# Patient Record
Sex: Female | Born: 1995 | Race: White | Hispanic: No | Marital: Single | Smoking: Never smoker
Health system: Southern US, Community
[De-identification: ages and names within clinical notes are randomized; demographics above are authoritative.]

## PROBLEM LIST (undated history)

## (undated) DIAGNOSIS — R011 Cardiac murmur, unspecified: Secondary | ICD-10-CM

## (undated) DIAGNOSIS — J45909 Unspecified asthma, uncomplicated: Secondary | ICD-10-CM

## (undated) HISTORY — PX: WISDOM TOOTH EXTRACTION: SHX21

## (undated) HISTORY — PX: PATENT DUCTUS ARTERIOUS REPAIR: SHX269

---

## 2015-04-04 ENCOUNTER — Ambulatory Visit: Payer: Self-pay | Admitting: Family Medicine

## 2015-04-09 ENCOUNTER — Ambulatory Visit (INDEPENDENT_AMBULATORY_CARE_PROVIDER_SITE_OTHER): Payer: BLUE CROSS/BLUE SHIELD | Admitting: Family Medicine

## 2015-04-09 ENCOUNTER — Encounter: Payer: Self-pay | Admitting: Family Medicine

## 2015-04-09 VITALS — BP 110/60 | HR 83 | Temp 98.7°F | Ht 66.5 in | Wt 124.0 lb

## 2015-04-09 DIAGNOSIS — Z8709 Personal history of other diseases of the respiratory system: Secondary | ICD-10-CM

## 2015-04-09 DIAGNOSIS — J029 Acute pharyngitis, unspecified: Secondary | ICD-10-CM

## 2015-04-09 DIAGNOSIS — Z8619 Personal history of other infectious and parasitic diseases: Secondary | ICD-10-CM

## 2015-04-09 LAB — POCT RAPID STREP A (OFFICE): Rapid Strep A Screen: NEGATIVE

## 2015-04-09 NOTE — Progress Notes (Signed)
   Subjective:    Patient ID: Sherry Cummings, female    DOB: March 15, 1996, 19 y.o.   MRN: [id number]  HPI She is here for consult concerning history of strep pharyngitis. She did bring information with her concerning the diagnosis of strep. She was positive and treated with penicillin in early May. She was retested and a culture done which did show group see strep. She then had another episode of sore throat and was seen in an urgent care center. They strep screen again was positive and she was given amoxicillin. She recently finished this course and does complain of a slight sore throat.   Review of Systems     Objective:   Physical Exam Alert and in no distress. Tympanic membranes and canals are normal. Pharyngeal area is slightly red.. Neck is supple without adenopathy or thyromegaly. Cardiac exam shows a regular sinus rhythm without murmurs or gallops. Lungs are clear to auscultation.        Assessment & Plan:  Sore throat - Plan: POCT rapid strep A, Culture, Group A Strep  History of strep pharyngitis  the strep screen was negative however since she has had 2 positive strep tests, I think a culture would be appropriate. I discussed the fact that if it is indeed positive, I would switch her to a different anabiotic and then again retest her.

## 2015-04-10 ENCOUNTER — Telehealth: Payer: Self-pay | Admitting: Family Medicine

## 2015-04-10 NOTE — Telephone Encounter (Signed)
Pt called and stated she is feeling a lot worse today. She had fever and chills last night and today her throat is worse. Pt can be reached at [phone] and [facility] [address]. Please call pt.

## 2015-04-10 NOTE — Telephone Encounter (Signed)
error 

## 2015-04-11 ENCOUNTER — Other Ambulatory Visit (INDEPENDENT_AMBULATORY_CARE_PROVIDER_SITE_OTHER): Payer: BLUE CROSS/BLUE SHIELD

## 2015-04-11 DIAGNOSIS — J029 Acute pharyngitis, unspecified: Secondary | ICD-10-CM | POA: Diagnosis not present

## 2015-04-11 LAB — POCT RAPID STREP A (OFFICE): Rapid Strep A Screen: NEGATIVE

## 2015-04-11 LAB — CULTURE, GROUP A STREP: ORGANISM ID, BACTERIA: NORMAL

## 2015-04-12 ENCOUNTER — Encounter: Payer: Self-pay | Admitting: Family Medicine

## 2015-04-12 DIAGNOSIS — J029 Acute pharyngitis, unspecified: Secondary | ICD-10-CM

## 2015-04-12 DIAGNOSIS — Z8709 Personal history of other diseases of the respiratory system: Secondary | ICD-10-CM

## 2015-04-12 NOTE — Progress Notes (Signed)
   Subjective:    Patient ID: Sherry Cummings, female    DOB: March 18, 1996, 19 y.o.   MRN: [id number]  HPI She was seen on August 3 complaining of a one-day history of fever, chills, myalgias with slight sore throat, nasal congestion, ear congestion but no coughing. He has a previous history of difficulty with strep pharyngitis.  Review of Systems     Objective:   Physical Exam Alert and in no distress. Tympanic membranes and canals are normal. Pharyngeal area is normal. Neck is supple without adenopathy or thyromegaly. Cardiac exam shows a regular sinus rhythm without murmurs or gallops. Lungs are clear to auscultation. Strep screen negative. Previous strep culture was negative.       Assessment & Plan:  Sore throat  History of strep pharyngitis  Recommend supportive care and call if further trouble.

## 2015-04-20 NOTE — Telephone Encounter (Signed)
04/20/2015

## 2016-05-21 ENCOUNTER — Other Ambulatory Visit: Payer: Self-pay | Admitting: Orthopedic Surgery

## 2016-05-21 DIAGNOSIS — M25561 Pain in right knee: Secondary | ICD-10-CM

## 2016-05-23 ENCOUNTER — Ambulatory Visit
Admission: RE | Admit: 2016-05-23 | Discharge: 2016-05-23 | Disposition: A | Discharge status: Home / Self Care | Payer: BLUE CROSS/BLUE SHIELD | Source: Ambulatory Visit | Attending: Orthopedic Surgery | Admitting: Orthopedic Surgery

## 2016-05-23 DIAGNOSIS — M25561 Pain in right knee: Secondary | ICD-10-CM

## 2016-05-29 ENCOUNTER — Other Ambulatory Visit: Payer: Self-pay | Admitting: Orthopedic Surgery

## 2016-06-04 ENCOUNTER — Encounter (HOSPITAL_COMMUNITY)
Admission: RE | Admit: 2016-06-04 | Discharge: 2016-06-04 | Disposition: A | Discharge status: Home / Self Care | Payer: BLUE CROSS/BLUE SHIELD | Source: Ambulatory Visit | Attending: Orthopedic Surgery | Admitting: Orthopedic Surgery

## 2016-06-04 ENCOUNTER — Other Ambulatory Visit (INDEPENDENT_AMBULATORY_CARE_PROVIDER_SITE_OTHER): Payer: Self-pay | Admitting: Orthopedic Surgery

## 2016-06-04 ENCOUNTER — Other Ambulatory Visit: Payer: Self-pay | Admitting: Orthopedic Surgery

## 2016-06-04 ENCOUNTER — Encounter (HOSPITAL_COMMUNITY): Payer: Self-pay

## 2016-06-04 DIAGNOSIS — J45909 Unspecified asthma, uncomplicated: Secondary | ICD-10-CM | POA: Diagnosis not present

## 2016-06-04 DIAGNOSIS — Y9366 Activity, soccer: Secondary | ICD-10-CM | POA: Diagnosis not present

## 2016-06-04 DIAGNOSIS — X58XXXA Exposure to other specified factors, initial encounter: Secondary | ICD-10-CM | POA: Diagnosis not present

## 2016-06-04 DIAGNOSIS — S83281A Other tear of lateral meniscus, current injury, right knee, initial encounter: Secondary | ICD-10-CM | POA: Diagnosis not present

## 2016-06-04 DIAGNOSIS — S83511A Sprain of anterior cruciate ligament of right knee, initial encounter: Secondary | ICD-10-CM | POA: Diagnosis not present

## 2016-06-04 DIAGNOSIS — Z8774 Personal history of (corrected) congenital malformations of heart and circulatory system: Secondary | ICD-10-CM | POA: Diagnosis not present

## 2016-06-04 HISTORY — DX: Cardiac murmur, unspecified: R01.1

## 2016-06-04 HISTORY — DX: Unspecified asthma, uncomplicated: J45.909

## 2016-06-04 LAB — CBC
HCT: 44.1 % (ref 36.0–46.0)
Hemoglobin: 14.4 g/dL (ref 12.0–15.0)
MCH: 29.4 pg (ref 26.0–34.0)
MCHC: 32.7 g/dL (ref 30.0–36.0)
MCV: 90.2 fL (ref 78.0–100.0)
PLATELETS: 295 10*3/uL (ref 150–400)
RBC: 4.89 MIL/uL (ref 3.87–5.11)
RDW: 12.5 % (ref 11.5–15.5)
WBC: 6.9 10*3/uL (ref 4.0–10.5)

## 2016-06-04 LAB — HCG, SERUM, QUALITATIVE: PREG SERUM: NEGATIVE

## 2016-06-04 NOTE — Progress Notes (Signed)
PCP - Dr. Sharlot GowdaJohn Lalonde ([facility]) Cardiologist - denies  EKG - denies CXR - denies Echo/stress test/cardiac cath - denies  Patient denies chest pain and shortness of breath at PAT appointment.

## 2016-06-04 NOTE — Pre-Procedure Instructions (Signed)
Sherry LevelGabrielle Rester  06/04/2016      [id number], [place] - [address] [place] [address] Phone: [phone] Fax: [phone]    Your procedure is scheduled on Thursday, September 28th, 2017.  [facility] Ad[facility] at 9:30 A.M.   Call this number if you have problems the morning of surgery:  [phone]   Remember:  Do not eat food or drink liquids after midnight.   Take these medicines the morning of surgery with A SIP OF WATER: None.  Stop taking: Aspirin, NSAIDS, Aleve, Naproxen, Ibuprofen, Advil, Motrin, BC's, Goody's, Fish oil, all herbal medications, and all vitamins.     Do not wear jewelry, make-up or nail polish.  Do not wear lotions, powders, or perfumes, or deoderant.  Do not shave 48 hours prior to surgery.   Do not bring valuables to the hospital.  [facility] is not responsible for any belongings or valuables.  Contacts, dentures or bridgework may not be worn into surgery.  Leave your suitcase in the car.  After surgery it may be brought to your room.  For patients admitted to the hospital, discharge time will be determined by your treatment team.  Patients discharged the day of surgery will not be allowed to drive home.   Special instructions:  Preparing for Surgery.   Please read over the following fact sheets that you were given.   [facility]- Preparing For Surgery  Before surgery, you can play an important role. Because skin is not sterile, your skin needs to be as free of germs as possible. You can reduce the number of germs on your skin by washing with CHG (chlorahexidine gluconate) Soap before surgery.  CHG is an antiseptic cleaner which kills germs and bonds with the skin to continue killing germs even after washing.  Please do not use if you have an allergy to CHG or antibacterial soaps. If your skin becomes reddened/irritated stop using the CHG.  Do not shave (including  legs and underarms) for at least 48 hours prior to first CHG shower. It is OK to shave your face.  Please follow these instructions carefully.   1. Shower the NIGHT BEFORE SURGERY and the MORNING OF SURGERY with CHG.   2. If you chose to wash your hair, wash your hair first as usual with your normal shampoo.  3. After you shampoo, rinse your hair and body thoroughly to remove the shampoo.  4. Use CHG as you would any other liquid soap. You can apply CHG directly to the skin and wash gently with a scrungie or a clean washcloth.   5. Apply the CHG Soap to your body ONLY FROM THE NECK DOWN.  Do not use on open wounds or open sores. Avoid contact with your eyes, ears, mouth and genitals (private parts). Wash genitals (private parts) with your normal soap.  6. Wash thoroughly, paying special attention to the area where your surgery will be performed.  7. Thoroughly rinse your body with warm water from the neck down.  8. DO NOT shower/wash with your normal soap after using and rinsing off the CHG Soap.  9. Pat yourself dry with a CLEAN TOWEL.   10. Wear CLEAN PAJAMAS   11. Place CLEAN SHEETS on your bed the night of your first shower and DO NOT SLEEP WITH PETS.  Day of Surgery: Do not apply any deodorants/lotions. Please wear clean clothes to the hospital/surgery center.

## 2016-06-05 ENCOUNTER — Ambulatory Visit (HOSPITAL_COMMUNITY): Payer: BLUE CROSS/BLUE SHIELD | Admitting: Anesthesiology

## 2016-06-05 ENCOUNTER — Ambulatory Visit (HOSPITAL_COMMUNITY)
Admission: RE | Admit: 2016-06-05 | Discharge: 2016-06-05 | Disposition: A | Discharge status: Home / Self Care | Payer: BLUE CROSS/BLUE SHIELD | Source: Ambulatory Visit | Attending: Orthopedic Surgery | Admitting: Orthopedic Surgery

## 2016-06-05 ENCOUNTER — Encounter (HOSPITAL_COMMUNITY): Payer: Self-pay | Admitting: Anesthesiology

## 2016-06-05 ENCOUNTER — Encounter (HOSPITAL_COMMUNITY)
Admission: RE | Disposition: A | Discharge status: Home / Self Care | Payer: Self-pay | Source: Ambulatory Visit | Attending: Orthopedic Surgery

## 2016-06-05 DIAGNOSIS — Z8774 Personal history of (corrected) congenital malformations of heart and circulatory system: Secondary | ICD-10-CM | POA: Insufficient documentation

## 2016-06-05 DIAGNOSIS — S83511A Sprain of anterior cruciate ligament of right knee, initial encounter: Secondary | ICD-10-CM | POA: Diagnosis not present

## 2016-06-05 DIAGNOSIS — Y9366 Activity, soccer: Secondary | ICD-10-CM | POA: Insufficient documentation

## 2016-06-05 DIAGNOSIS — S83281A Other tear of lateral meniscus, current injury, right knee, initial encounter: Secondary | ICD-10-CM | POA: Insufficient documentation

## 2016-06-05 DIAGNOSIS — X58XXXA Exposure to other specified factors, initial encounter: Secondary | ICD-10-CM | POA: Insufficient documentation

## 2016-06-05 DIAGNOSIS — J45909 Unspecified asthma, uncomplicated: Secondary | ICD-10-CM | POA: Insufficient documentation

## 2016-06-05 HISTORY — PX: ANTERIOR CRUCIATE LIGAMENT REPAIR: SHX115

## 2016-06-05 SURGERY — RECONSTRUCTION, KNEE, ACL, USING HAMSTRING GRAFT
Anesthesia: General | Site: Knee | Laterality: Right

## 2016-06-05 MED ORDER — ONDANSETRON HCL 4 MG/2ML IJ SOLN
INTRAMUSCULAR | Status: DC | PRN
  Administered 2016-06-05 (×2): 4 mg via INTRAVENOUS

## 2016-06-05 MED ORDER — LACTATED RINGERS IV SOLN
INTRAVENOUS | Status: DC | PRN
  Administered 2016-06-05 (×2): via INTRAVENOUS

## 2016-06-05 MED ORDER — SCOPOLAMINE 1 MG/3DAYS TD PT72
MEDICATED_PATCH | TRANSDERMAL | Status: DC | PRN
  Administered 2016-06-05: 1 via TRANSDERMAL

## 2016-06-05 MED ORDER — PROMETHAZINE HCL 25 MG/ML IJ SOLN: 6.2500 mg | INTRAMUSCULAR | Status: DC | PRN

## 2016-06-05 MED ORDER — SODIUM CHLORIDE 0.9 % IR SOLN
Status: DC | PRN
  Administered 2016-06-05 (×4)

## 2016-06-05 MED ORDER — 0.9 % SODIUM CHLORIDE (POUR BTL) OPTIME
TOPICAL | Status: DC | PRN
  Administered 2016-06-05: 1000 mL

## 2016-06-05 MED ORDER — BUPIVACAINE HCL (PF) 0.25 % IJ SOLN
INTRAMUSCULAR | Status: AC
  Filled 2016-06-05: qty 30

## 2016-06-05 MED ORDER — DEXAMETHASONE SODIUM PHOSPHATE 10 MG/ML IJ SOLN
INTRAMUSCULAR | Status: DC | PRN
  Administered 2016-06-05: 10 mg via INTRAVENOUS

## 2016-06-05 MED ORDER — OXYCODONE-ACETAMINOPHEN 5-325 MG PO TABS: 1.0000 | ORAL_TABLET | Freq: Four times a day (QID) | ORAL | 0 refills | Status: AC | PRN

## 2016-06-05 MED ORDER — CLONIDINE HCL (ANALGESIA) 100 MCG/ML EP SOLN
EPIDURAL | Status: DC | PRN
  Administered 2016-06-05: .75 mL via INTRA_ARTICULAR

## 2016-06-05 MED ORDER — ONDANSETRON HCL 4 MG/2ML IJ SOLN
INTRAMUSCULAR | Status: AC
  Filled 2016-06-05: qty 2

## 2016-06-05 MED ORDER — EPINEPHRINE HCL 1 MG/ML IJ SOLN
INTRAMUSCULAR | Status: AC
  Filled 2016-06-05: qty 4

## 2016-06-05 MED ORDER — CLONIDINE HCL (ANALGESIA) 100 MCG/ML EP SOLN: 150.0000 ug | Freq: Once | EPIDURAL | Status: DC

## 2016-06-05 MED ORDER — PROPOFOL 10 MG/ML IV BOLUS
INTRAVENOUS | Status: DC | PRN
  Administered 2016-06-05: 200 mg via INTRAVENOUS

## 2016-06-05 MED ORDER — CHLORHEXIDINE GLUCONATE 4 % EX LIQD: 60.0000 mL | Freq: Once | CUTANEOUS | Status: DC

## 2016-06-05 MED ORDER — CEFAZOLIN SODIUM-DEXTROSE 2-4 GM/100ML-% IV SOLN
2.0000 g | INTRAVENOUS | Status: AC
  Administered 2016-06-05: 2 g via INTRAVENOUS
  Filled 2016-06-05: qty 100

## 2016-06-05 MED ORDER — FENTANYL CITRATE (PF) 100 MCG/2ML IJ SOLN
INTRAMUSCULAR | Status: AC
  Filled 2016-06-05: qty 2

## 2016-06-05 MED ORDER — STERILE WATER FOR IRRIGATION IR SOLN
Status: DC | PRN
  Administered 2016-06-05: 100 mL

## 2016-06-05 MED ORDER — ASPIRIN EC 325 MG PO TBEC: 325.0000 mg | DELAYED_RELEASE_TABLET | Freq: Every day | ORAL | 0 refills | Status: AC

## 2016-06-05 MED ORDER — MORPHINE SULFATE (PF) 4 MG/ML IV SOLN
INTRAVENOUS | Status: DC | PRN
  Administered 2016-06-05: 8 mg via SUBCUTANEOUS

## 2016-06-05 MED ORDER — HYDROMORPHONE HCL 1 MG/ML IJ SOLN: 0.2500 mg | INTRAMUSCULAR | Status: DC | PRN

## 2016-06-05 MED ORDER — ACETAMINOPHEN 160 MG/5ML PO SOLN: 325.0000 mg | ORAL | Status: DC | PRN

## 2016-06-05 MED ORDER — METHOCARBAMOL 500 MG PO TABS: 500.0000 mg | ORAL_TABLET | Freq: Three times a day (TID) | ORAL | 0 refills | Status: AC | PRN

## 2016-06-05 MED ORDER — OXYCODONE HCL 5 MG PO TABS: 5.0000 mg | ORAL_TABLET | Freq: Once | ORAL | Status: DC | PRN

## 2016-06-05 MED ORDER — BUPIVACAINE-EPINEPHRINE 0.25% -1:200000 IJ SOLN
INTRAMUSCULAR | Status: DC | PRN
  Administered 2016-06-05: 30 mL

## 2016-06-05 MED ORDER — MIDAZOLAM HCL 5 MG/5ML IJ SOLN
INTRAMUSCULAR | Status: DC | PRN
  Administered 2016-06-05: 2 mg via INTRAVENOUS

## 2016-06-05 MED ORDER — MORPHINE SULFATE (PF) 4 MG/ML IV SOLN
INTRAVENOUS | Status: AC
  Filled 2016-06-05: qty 1

## 2016-06-05 MED ORDER — FENTANYL CITRATE (PF) 100 MCG/2ML IJ SOLN
INTRAMUSCULAR | Status: DC | PRN
  Administered 2016-06-05: 100 ug via INTRAVENOUS
  Administered 2016-06-05 (×2): 50 ug via INTRAVENOUS

## 2016-06-05 MED ORDER — PROPOFOL 10 MG/ML IV BOLUS
INTRAVENOUS | Status: AC
  Filled 2016-06-05: qty 20

## 2016-06-05 MED ORDER — LACTATED RINGERS IV SOLN
INTRAVENOUS | Status: DC
  Administered 2016-06-05: 10:00:00 via INTRAVENOUS

## 2016-06-05 MED ORDER — BUPIVACAINE-EPINEPHRINE (PF) 0.25% -1:200000 IJ SOLN
INTRAMUSCULAR | Status: AC
  Filled 2016-06-05: qty 30

## 2016-06-05 MED ORDER — SCOPOLAMINE 1 MG/3DAYS TD PT72
MEDICATED_PATCH | TRANSDERMAL | Status: AC
  Filled 2016-06-05: qty 1

## 2016-06-05 MED ORDER — BUPIVACAINE HCL (PF) 0.25 % IJ SOLN
INTRAMUSCULAR | Status: DC | PRN
Start: 2016-06-05 — End: 2016-06-05
  Administered 2016-06-05: 10 mL via INTRA_ARTICULAR

## 2016-06-05 MED ORDER — LACTATED RINGERS IV SOLN: INTRAVENOUS | Status: DC

## 2016-06-05 MED ORDER — ACETAMINOPHEN 325 MG PO TABS: 325.0000 mg | ORAL_TABLET | ORAL | Status: DC | PRN

## 2016-06-05 MED ORDER — OXYCODONE HCL 5 MG/5ML PO SOLN: 5.0000 mg | Freq: Once | ORAL | Status: DC | PRN

## 2016-06-05 MED ORDER — DEXAMETHASONE SODIUM PHOSPHATE 10 MG/ML IJ SOLN
INTRAMUSCULAR | Status: AC
  Filled 2016-06-05: qty 1

## 2016-06-05 MED ORDER — ROCURONIUM BROMIDE 100 MG/10ML IV SOLN
INTRAVENOUS | Status: DC | PRN
  Administered 2016-06-05: 50 mg via INTRAVENOUS

## 2016-06-05 MED ORDER — MIDAZOLAM HCL 2 MG/2ML IJ SOLN
INTRAMUSCULAR | Status: AC
  Filled 2016-06-05: qty 2

## 2016-06-05 MED ORDER — BUPIVACAINE-EPINEPHRINE (PF) 0.5% -1:200000 IJ SOLN
INTRAMUSCULAR | Status: DC | PRN
  Administered 2016-06-05: 25 mL via PERINEURAL

## 2016-06-05 MED ORDER — LIDOCAINE 2% (20 MG/ML) 5 ML SYRINGE
INTRAMUSCULAR | Status: AC
  Filled 2016-06-05: qty 5

## 2016-06-05 SURGICAL SUPPLY — 94 items
ANCHOR BUTTON TIGHTROPE ACL RT (Orthopedic Implant) ×2 IMPLANT
BANDAGE ESMARK 6X9 LF (GAUZE/BANDAGES/DRESSINGS) IMPLANT
BLADE CUTTER GATOR 3.5 (BLADE) IMPLANT
BLADE GREAT WHITE 4.2 (BLADE) ×2 IMPLANT
BLADE SURG 10 STRL SS (BLADE) IMPLANT
BLADE SURG 15 STRL LF DISP TIS (BLADE) IMPLANT
BLADE SURG 15 STRL SS (BLADE)
BNDG ELASTIC 6X15 VLCR STRL LF (GAUZE/BANDAGES/DRESSINGS) ×2 IMPLANT
BNDG ESMARK 6X9 LF (GAUZE/BANDAGES/DRESSINGS)
BONE MATRIX DEMINERALIZED 1CC (Bone Implant) ×4 IMPLANT
BUR OVAL 6.0 (BURR) ×2 IMPLANT
COVER MAYO STAND STRL (DRAPES) ×2 IMPLANT
COVER SURGICAL LIGHT HANDLE (MISCELLANEOUS) ×2 IMPLANT
CUFF TOURNIQUET SINGLE 34IN LL (TOURNIQUET CUFF) ×2 IMPLANT
CUFF TOURNIQUET SINGLE 44IN (TOURNIQUET CUFF) IMPLANT
DECANTER SPIKE VIAL GLASS SM (MISCELLANEOUS) ×2 IMPLANT
DRAPE ARTHROSCOPY W/POUCH 114 (DRAPES) ×4 IMPLANT
DRAPE INCISE IOBAN 66X45 STRL (DRAPES) ×4 IMPLANT
DRAPE ORTHO SPLIT 77X108 STRL (DRAPES) ×1
DRAPE SURG ORHT 6 SPLT 77X108 (DRAPES) ×1 IMPLANT
DRAPE U-SHAPE 47X51 STRL (DRAPES) ×2 IMPLANT
DRILL FLIPCUTTER II 7.5MM (MISCELLANEOUS) ×1 IMPLANT
DRILL FLIPCUTTER II 8.0MM (INSTRUMENTS) ×1 IMPLANT
DRILL FLIPCUTTER II 8.5MM (INSTRUMENTS) IMPLANT
DRILL FLIPCUTTER II 9.0MM (INSTRUMENTS) IMPLANT
DRSG PAD ABDOMINAL 8X10 ST (GAUZE/BANDAGES/DRESSINGS) IMPLANT
DRSG TEGADERM 4X4.75 (GAUZE/BANDAGES/DRESSINGS) ×6 IMPLANT
DURAPREP 26ML APPLICATOR (WOUND CARE) ×2 IMPLANT
ELECT REM PT RETURN 9FT ADLT (ELECTROSURGICAL) ×2
ELECTRODE REM PT RTRN 9FT ADLT (ELECTROSURGICAL) ×1 IMPLANT
FLIPCUTTER II 7.5MM (MISCELLANEOUS) ×2
FLIPCUTTER II 8.0MM (INSTRUMENTS) ×2
FLIPCUTTER II 8.5MM (INSTRUMENTS)
FLIPCUTTER II 9.0MM (INSTRUMENTS)
GAUZE SPONGE 4X4 12PLY STRL (GAUZE/BANDAGES/DRESSINGS) ×2 IMPLANT
GAUZE XEROFORM 1X8 LF (GAUZE/BANDAGES/DRESSINGS) ×2 IMPLANT
GLOVE BIO SURGEON STRL SZ 6.5 (GLOVE) ×2 IMPLANT
GLOVE BIOGEL PI IND STRL 6.5 (GLOVE) ×2 IMPLANT
GLOVE BIOGEL PI IND STRL 7.0 (GLOVE) ×1 IMPLANT
GLOVE BIOGEL PI IND STRL 8 (GLOVE) ×3 IMPLANT
GLOVE BIOGEL PI INDICATOR 6.5 (GLOVE) ×2
GLOVE BIOGEL PI INDICATOR 7.0 (GLOVE) ×1
GLOVE BIOGEL PI INDICATOR 8 (GLOVE) ×3
GLOVE ECLIPSE 7.0 STRL STRAW (GLOVE) ×2 IMPLANT
GLOVE ORTHO TXT STRL SZ7.5 (GLOVE) ×2 IMPLANT
GLOVE SURG ORTHO 8.0 STRL STRW (GLOVE) ×2 IMPLANT
GLOVE SURG SS PI 6.5 STRL IVOR (GLOVE) ×6 IMPLANT
GOWN SPEC L3 XXLG W/TWL (GOWN DISPOSABLE) ×2 IMPLANT
GOWN STRL REUS W/ TWL LRG LVL3 (GOWN DISPOSABLE) ×4 IMPLANT
GOWN STRL REUS W/TWL LRG LVL3 (GOWN DISPOSABLE) ×4
IMMOBILIZER KNEE 22 UNIV (SOFTGOODS) ×2 IMPLANT
KIT BASIN OR (CUSTOM PROCEDURE TRAY) ×2 IMPLANT
KIT BIOCARTILAGE DEL W/SYRINGE (KITS) ×2 IMPLANT
KIT ROOM TURNOVER OR (KITS) ×2 IMPLANT
MANIFOLD NEPTUNE II (INSTRUMENTS) ×2 IMPLANT
NEEDLE 18GX1X1/2 (RX/OR ONLY) (NEEDLE) IMPLANT
NEEDLE FILTER BLUNT 18X 1/2SAF (NEEDLE) ×1
NEEDLE FILTER BLUNT 18X1 1/2 (NEEDLE) ×1 IMPLANT
NEEDLE HYPO 25GX1X1/2 BEV (NEEDLE) ×8 IMPLANT
NS IRRIG 1000ML POUR BTL (IV SOLUTION) ×2 IMPLANT
PACK ARTHROSCOPY DSU (CUSTOM PROCEDURE TRAY) ×2 IMPLANT
PAD ARMBOARD 7.5X6 YLW CONV (MISCELLANEOUS) ×2 IMPLANT
PAD CAST 4YDX4 CTTN HI CHSV (CAST SUPPLIES) ×1 IMPLANT
PADDING CAST COTTON 4X4 STRL (CAST SUPPLIES) ×1
PADDING CAST COTTON 6X4 STRL (CAST SUPPLIES) IMPLANT
PENCIL BUTTON HOLSTER BLD 10FT (ELECTRODE) ×2 IMPLANT
PK GRAFTLINK AUTO IMPLANT SYST (Anchor) ×2 IMPLANT
SET ARTHROSCOPY TUBING (MISCELLANEOUS) ×1
SET ARTHROSCOPY TUBING LN (MISCELLANEOUS) ×1 IMPLANT
SPONGE GAUZE 4X4 12PLY STER LF (GAUZE/BANDAGES/DRESSINGS) IMPLANT
SPONGE LAP 18X18 X RAY DECT (DISPOSABLE) ×2 IMPLANT
SPONGE LAP 4X18 X RAY DECT (DISPOSABLE) IMPLANT
SPONGE SCRUB IODOPHOR (GAUZE/BANDAGES/DRESSINGS) IMPLANT
STRIP CLOSURE SKIN 1/2X4 (GAUZE/BANDAGES/DRESSINGS) ×2 IMPLANT
SUCTION FRAZIER HANDLE 10FR (MISCELLANEOUS) ×1
SUCTION TUBE FRAZIER 10FR DISP (MISCELLANEOUS) ×1 IMPLANT
SUT ETHILON 3 0 PS 1 (SUTURE) ×4 IMPLANT
SUT MNCRL AB 3-0 PS2 18 (SUTURE) ×2 IMPLANT
SUT VIC AB 0 CT1 27 (SUTURE) ×1
SUT VIC AB 0 CT1 27XBRD ANBCTR (SUTURE) ×1 IMPLANT
SUT VIC AB 2-0 CT1 27 (SUTURE) ×4
SUT VIC AB 2-0 CT1 TAPERPNT 27 (SUTURE) ×4 IMPLANT
SUT VICRYL 0 UR6 27IN ABS (SUTURE) ×4 IMPLANT
SYR 30ML LL (SYRINGE) ×2 IMPLANT
SYR BULB IRRIGATION 50ML (SYRINGE) ×2 IMPLANT
SYR TB 1ML LUER SLIP (SYRINGE) ×8 IMPLANT
SYSTEM GRAFT IMPLANT AUTOGRAFT (Anchor) ×1 IMPLANT
TOWEL OR 17X24 6PK STRL BLUE (TOWEL DISPOSABLE) ×2 IMPLANT
TOWEL OR 17X26 10 PK STRL BLUE (TOWEL DISPOSABLE) ×2 IMPLANT
UNDERPAD 30X30 (UNDERPADS AND DIAPERS) ×2 IMPLANT
WAND 90 DEG TURBOVAC W/CORD (SURGICAL WAND) IMPLANT
WAND HAND CNTRL MULTIVAC 90 (MISCELLANEOUS) ×2 IMPLANT
WRAP KNEE MAXI GEL POST OP (GAUZE/BANDAGES/DRESSINGS) ×2 IMPLANT
YANKAUER SUCT BULB TIP NO VENT (SUCTIONS) ×2 IMPLANT

## 2016-06-05 NOTE — Brief Op Note (Signed)
06/05/2016  3:34 PM  PATIENT:  Sherry Cummings  19 y.o. female  PRE-OPERATIVE DIAGNOSIS:  RIGHT ANTERIOR CRUCIATE LIGAMENT TEAR, LATERAL MENISCAL TEAR  POST-OPERATIVE DIAGNOSIS:  RIGHT ANTERIOR CRUCIATE LIGAMENT TEAR, LATERAL MENISCAL TEAR  PROCEDURE:  Procedure(s): RECONSTRUCTION ANTERIOR CRUCIATE LIGAMENT (ACL) WITH HAMSTRING GRAFT, PARTIAL LATERAL MENISECTOMY  SURGEON:  Surgeon(s): Cammy CopaScott Howell Groesbeck, MD  ASSISTANT: Patrick Jupiterarla Bethune rnfa  ANESTHESIA:   general  EBL: 5 ml    Total I/O In: 1000 [I.V.:1000] Out: 35 [Blood:35]  BLOOD ADMINISTERED: none  DRAINS: none   LOCAL MEDICATIONS USED:  Marcaine mso4 clonodine  SPECIMEN:  No Specimen  COUNTS:  YES  TOURNIQUET:  * No tourniquets in log *  DICTATION: .Other Dictation: Dictation Number [id number]  PLAN OF CARE: Discharge to home after PACU  PATIENT DISPOSITION:  PACU - hemodynamically stable

## 2016-06-05 NOTE — Anesthesia Preprocedure Evaluation (Signed)
Anesthesia Evaluation  Patient identified by MRN, date of birth, ID band Patient awake    Reviewed: Allergy & Precautions, NPO status , Patient's Chart, lab work & pertinent test results  History of Anesthesia Complications Negative for: history of anesthetic complications  Airway Mallampati: I  TM Distance: >3 FB Neck ROM: Full    Dental  (+) Teeth Intact   Pulmonary asthma ,    breath sounds clear to auscultation       Cardiovascular negative cardio ROS   Rhythm:Regular     Neuro/Psych negative neurological ROS  negative psych ROS   GI/Hepatic negative GI ROS, Neg liver ROS,   Endo/Other  negative endocrine ROS  Renal/GU negative Renal ROS     Musculoskeletal Right acl tear   Abdominal   Peds  Hematology negative hematology ROS (+)   Anesthesia Other Findings   Reproductive/Obstetrics                             Anesthesia Physical Anesthesia Plan  ASA: I  Anesthesia Plan: General   Post-op Pain Management:  Regional for Post-op pain   Induction: Intravenous  Airway Management Planned: LMA and Oral ETT  Additional Equipment: None  Intra-op Plan:   Post-operative Plan: Extubation in OR  Informed Consent: I have reviewed the patients History and Physical, chart, labs and discussed the procedure including the risks, benefits and alternatives for the proposed anesthesia with the patient or authorized representative who has indicated his/her understanding and acceptance.   Dental advisory given  Plan Discussed with: CRNA and Surgeon  Anesthesia Plan Comments:         Anesthesia Quick Evaluation

## 2016-06-05 NOTE — H&P (Signed)
Sherry Cummings is an 20 y.o. female.   Chief Complaint: Right knee pain and instability HPI: Sherry Cummings is a 20 year old patient with right knee pain and instability.  She injured her knee several weeks ago playing soccer.  She describes symptomatic instability and pain in the knee.  She has lateral meniscal tear and anterior cruciate ligament tear by MRI scan along with partial MCL tear.  She has achieved an excellent range of motion prior to surgery.  No family history of DVT or pulmonary embolism  Past Medical History:  Diagnosis Date  . Asthma    sports induced  . Heart murmur    history of PDA    Past Surgical History:  Procedure Laterality Date  . PATENT DUCTUS ARTERIOUS REPAIR     age 89  . WISDOM TOOTH EXTRACTION      History reviewed. No pertinent family history. Social History:  reports that she has never smoked. She has never used smokeless tobacco. She reports that she does not drink alcohol or use drugs.  Allergies: No Known Allergies  Medications Prior to Admission  Medication Sig Dispense Refill  . desogestrel-ethinyl estradiol (APRI,EMOQUETTE,SOLIA) 0.15-30 MG-MCG tablet Take 1 tablet by mouth at bedtime.     . montelukast (SINGULAIR) 10 MG tablet Take 10 mg by mouth at bedtime.    . Multiple Vitamins-Minerals (MULTIVITAMIN WITH MINERALS) tablet Take 2 tablets by mouth daily.       Results for orders placed or performed during the hospital encounter of 06/04/16 (from the past 48 hour(s))  hCG, serum, qualitative     Status: None   Collection Time: 06/04/16 12:41 PM  Result Value Ref Range   Preg, Serum NEGATIVE NEGATIVE    Comment:        THE SENSITIVITY OF THIS METHODOLOGY IS >10 mIU/mL.   CBC     Status: None   Collection Time: 06/04/16 12:41 PM  Result Value Ref Range   WBC 6.9 4.0 - 10.5 K/uL   RBC 4.89 3.87 - 5.11 MIL/uL   Hemoglobin 14.4 12.0 - 15.0 g/dL   HCT 65.744.1 84.636.0 - 96.246.0 %   MCV 90.2 78.0 - 100.0 fL   MCH 29.4 26.0 - 34.0 pg   MCHC 32.7  30.0 - 36.0 g/dL   RDW 95.212.5 84.111.5 - 32.415.5 %   Platelets 295 150 - 400 K/uL   No results found.  Review of Systems  Musculoskeletal: Positive for joint pain.  All other systems reviewed and are negative.   Blood pressure 120/68, pulse 73, temperature 98 F (36.7 C), temperature source Oral, resp. rate 20, height 5\' 7"  (1.702 m), weight 57.1 kg (125 lb 12.8 oz), last menstrual period 05/17/2016, SpO2 100 %. Physical Exam  Constitutional: She appears well-developed.  HENT:  Head: Normocephalic.  Eyes: Pupils are equal, round, and reactive to light.  Neck: Normal range of motion.  Cardiovascular: Normal rate.   Respiratory: Effort normal.  Neurological: She is alert.  Skin: Skin is warm.  Psychiatric: She has a normal mood and affect.   examination the right knee demonstrates about a 3 flexion contracture which is the same as her left knee.  She has full flexion.  She has good collateral stability to varus and valgus stress at 0 and 30 anterior cruciate ligament is out PCL is intact there is no posterior lateral rotatory instability  Assessment/Plan Impression is right knee anterior cruciate ligament tear with lateral meniscal tear plan a cell reconstruction hamstring autograft meniscal repair versus resection.  On MRI scan it looks like this tear is not repairable.  Risks and benefits discussed with the patient could not limited to infection or vessel damage knee stiffness.  All questions answered.  Cammy Copa, MD 06/05/2016, 12:27 PM

## 2016-06-05 NOTE — Anesthesia Procedure Notes (Signed)
Procedure Name: Intubation Date/Time: 06/05/2016 12:46 PM Performed by: Leonel Ramsay'LAUGHLIN, Kameran Mcneese H Pre-anesthesia Checklist: Patient identified, Emergency Drugs available, Suction available, Patient being monitored and Timeout performed Patient Re-evaluated:Patient Re-evaluated prior to inductionOxygen Delivery Method: Circle system utilized Preoxygenation: Pre-oxygenation with 100% oxygen Intubation Type: IV induction Ventilation: Mask ventilation without difficulty Laryngoscope Size: Mac and 3 Grade View: Grade I Tube type: Oral Tube size: 7.0 mm Number of attempts: 1 Airway Equipment and Method: Stylet Placement Confirmation: ETT inserted through vocal cords under direct vision,  positive ETCO2 and breath sounds checked- equal and bilateral Secured at: 21 cm Tube secured with: Tape Dental Injury: Teeth and Oropharynx as per pre-operative assessment

## 2016-06-05 NOTE — Anesthesia Procedure Notes (Signed)
Anesthesia Regional Block:  Adductor canal block  Pre-Anesthetic Checklist: ,, timeout performed, Correct Patient, Correct Site, Correct Laterality, Correct Procedure, Correct Position, site marked, surgical consent, pre-op evaluation, At surgeon's request  Laterality: Right and Lower  Prep: chloraprep       Needles:   Needle Type: Echogenic Stimulator Needle     Needle Length: 9cm 9 cm Needle Gauge: 22 and 22 G  Needle insertion depth: 4 cm   Additional Needles:  Procedures: ultrasound guided (picture in chart) Adductor canal block Narrative:  Start time: 06/05/2016 12:45 PM End time: 06/05/2016 12:50 PM Injection made incrementally with aspirations every 5 mL. Anesthesiologist: Izen Petz

## 2016-06-05 NOTE — Transfer of Care (Signed)
Immediate Anesthesia Transfer of Care Note  Patient: Sherry Cummings  Procedure(s) Performed: Procedure(s): RECONSTRUCTION ANTERIOR CRUCIATE LIGAMENT (ACL) WITH HAMSTRING GRAFT, PARTIAL LATERAL MENISECTOMY (Right)  Patient Location: PACU  Anesthesia Type:GA combined with regional for post-op pain  Level of Consciousness: awake, alert , oriented and patient cooperative  Airway & Oxygen Therapy: Patient Spontanous Breathing and Patient connected to nasal cannula oxygen  Post-op Assessment: Report given to RN and Post -op Vital signs reviewed and stable  Post vital signs: Reviewed and stable  Last Vitals:  Vitals:   06/05/16 0948  BP: 120/68  Pulse: 73  Resp: 20  Temp: 36.7 C    Last Pain:  Vitals:   06/05/16 0948  TempSrc: Oral      Patients Stated Pain Goal: 3 (06/05/16 0936)  Complications: No apparent anesthesia complications

## 2016-06-05 NOTE — Anesthesia Postprocedure Evaluation (Signed)
Anesthesia Post Note  Patient: Vinnie LevelGabrielle Langlais  Procedure(s) Performed: Procedure(s) (LRB): RECONSTRUCTION ANTERIOR CRUCIATE LIGAMENT (ACL) WITH HAMSTRING GRAFT, PARTIAL LATERAL MENISECTOMY (Right)  Patient location during evaluation: PACU Anesthesia Type: General and Regional Level of consciousness: awake and alert Pain management: pain level controlled Vital Signs Assessment: post-procedure vital signs reviewed and stable Respiratory status: spontaneous breathing, nonlabored ventilation and respiratory function stable Cardiovascular status: blood pressure returned to baseline and stable Postop Assessment: no signs of nausea or vomiting Anesthetic complications: no    Last Vitals:  Vitals:   06/05/16 1609 06/05/16 1615  BP:  (!) 128/91  Pulse:  75  Resp:  16  Temp: 36.5 C     Last Pain:  Vitals:   06/05/16 1615  TempSrc:   PainSc: 0-No pain                 Linton RumpJennifer Dickerson Kyliee Ortego

## 2016-06-06 ENCOUNTER — Encounter (HOSPITAL_COMMUNITY): Payer: Self-pay | Admitting: Orthopedic Surgery

## 2016-06-06 NOTE — Op Note (Signed)
NAME:  Sherry Cummings, Sherry Cummings NO.:  [id number]  MEDICAL RECORD NO.:  [id number]  LOCATION:  [facility]                         FACILITY:  [facility]  PHYSICIAN:  Burnard Bunting, M.D.    DATE OF BIRTH:  1995/11/12  DATE OF PROCEDURE: DATE OF DISCHARGE:  06/05/2016                              OPERATIVE REPORT   PREOPERATIVE DIAGNOSES:  Right knee anterior cruciate ligament tear. Lateral meniscal tear.  POSTOPERATIVE DIAGNOSES:  Right knee anterior cruciate ligament tear. Lateral meniscal tear.  PROCEDURE:  Right knee ACL reconstruction hamstring autograft, 8 mm Arthrex dual EndoButton technique with StimuBlast and partial lateral meniscectomy.  SURGEON:  Burnard Bunting, M.D.  ASSISTANT:  Hoy Finlay, RNFA.  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  OPERATIVE FINDINGS: 1. Examination under anesthesia, range of motion, the patient had     about a 5 degree flexion contracture in both knees preoperatively.     She had full flexion and good stability to varus and valgus stress     at 0 and 30 degrees.  Patella stability was excellent and symmetric     between both knees.  There was no posterolateral rotatory     instability on the right-hand side.  PCL was intact.  ACL was out     on the right-hand side with about 5 mm anterior drawer.  No     endpoint. Diagnostic arthroscopy. 1. No loose bodies medial lateral gutter. 2. Intact patellofemoral compartment. 3. Torn ACL, intact PCL. 4. Intact medial compartment, articular cartilage and meniscus. 5. Intact lateral compartment articular cartilage with a 50% posterior     horn medial meniscal tear which is right at the posterior horn     attachment.  The tear was flipped up into the intercondylar notch.     The patient also had anterior meniscal tear which was in the white-     white zone of the lateral  meniscus.  PROCEDURE IN DETAIL:  The patient was brought to the operating room where general anesthetic was induced.  Preoperative antibiotics administered.  Time-out was called.  Right leg was pre-scrubbed with alcohol and Betadine, allowed the air to dry, prepped with DuraPrep solution, draped in sterile manner.  Collier Flowers was used to cover the operative field.  The tourniquet was not utilized.  Incision was made over the pes bursa tendon.  Skin and subcutaneous tissue were sharply divided.  The semitendinosus tendon was harvested with care being taken to avoid injury to the branches of the saphenous nerve.  Prepared on the back table by Hoy Finlay, RNFA to a size 8 mm femoral tunnel and a 7.5 mm tibial tunnel.  The graft itself was 8 mm.  Graft preparation was performed.  At this time, thorough irrigation was performed of that incision.  Anterior and inferolateral portals were established. Anterior and inferomedial portals were established under direct visualization.  Diagnostic arthroscopy was performed.  Patellofemoral compartment intact.  Lateral compartment articular cartilage was intact, but there was a lateral meniscal tear radial component which was debrided.  There was no vertical component that the posterior horn  of lateral meniscal tear which reached the articular surface superiorly, on the inferior surface, there was only a line which could not be probed. All of this was posterior to the popliteus.  The patient also had anterior lateral meniscal tear in the white-white zone which was also debrided.  Following lateral meniscal debridement, medial compartment was inspected.  Meniscus and articular surfaces were intact.  At this time, ACL stump debrided.  Notchplasty performed.  The guide placed for the Arthrex flip cutter.  The tunnel was drilled at the 3 o'clock position through the native ACL footprint.  The tibial tunnel was then drilled also through the posterior aspect of  the native ACL footprint. Graft was passed and secured with an 8 tunnel of StimuBlast on the femoral side with the EndoButton and then on the tibial side.  The graft was tensioned with the knee in full extension.  Soft tissue site then was performed prior to graft tensioning.  At this time, thorough irrigation was performed of the joints.  The knee joint was thoroughly irrigated.  The incisions were irrigated.  Portals closed using 2-0 Vicryl and 3-0 nylon.  Harvest site was closed using interrupted inverted 0 Vicryl suture, 2-0 Vicryl suture, and 3-0 Monocryl.  Steri- Strips applied.  Solution of Marcaine, morphine, and clonidine injected into the knee.  Bulky dressing and Ace wrap and knee immobilizer were applied.  The patient tolerated the procedure well without immediate complications.  Transferred to the recovery room in stable condition.     Burnard BuntingG. Scott Dean, M.D.     GSD/MEDQ  D:  06/05/2016  T:  06/06/2016  Job:  [id number]

## 2016-06-12 ENCOUNTER — Inpatient Hospital Stay (INDEPENDENT_AMBULATORY_CARE_PROVIDER_SITE_OTHER): Payer: BLUE CROSS/BLUE SHIELD | Admitting: Orthopedic Surgery

## 2016-06-12 DIAGNOSIS — S83511D Sprain of anterior cruciate ligament of right knee, subsequent encounter: Secondary | ICD-10-CM | POA: Diagnosis not present

## 2016-06-12 DIAGNOSIS — S83281D Other tear of lateral meniscus, current injury, right knee, subsequent encounter: Secondary | ICD-10-CM | POA: Diagnosis not present

## 2016-06-24 ENCOUNTER — Encounter (HOSPITAL_COMMUNITY): Payer: Self-pay | Admitting: Orthopedic Surgery

## 2016-08-04 ENCOUNTER — Ambulatory Visit (INDEPENDENT_AMBULATORY_CARE_PROVIDER_SITE_OTHER): Payer: Self-pay | Admitting: Orthopedic Surgery

## 2016-08-04 DIAGNOSIS — S83511D Sprain of anterior cruciate ligament of right knee, subsequent encounter: Secondary | ICD-10-CM

## 2016-09-22 ENCOUNTER — Ambulatory Visit (INDEPENDENT_AMBULATORY_CARE_PROVIDER_SITE_OTHER): Payer: Self-pay | Admitting: Orthopedic Surgery

## 2016-09-22 DIAGNOSIS — S83511D Sprain of anterior cruciate ligament of right knee, subsequent encounter: Secondary | ICD-10-CM

## 2016-09-26 NOTE — Progress Notes (Signed)
   Post-Op Visit Note   Patient: Sherry Cummings           Date of Birth: 31-May-1996           MRN: [id number] Visit Date: 09/22/2016 PCP: Carollee HerterLALONDE,JOHN Wiater, MD   Assessment & Plan:  Chief Complaint: No chief complaint on file.  Visit Diagnoses:  1. Rupture of anterior cruciate ligament of right knee, subsequent encounter     Plan: Patient is now 4 months postop anterior cruciate ligament reconstruction.  Right knee has 18% quad deficit and 29% hamstring deficit.  At this time I think she's okay for running.  Graft is stable there is no effusion.  I will check her back in about 8 weeks  Follow-Up Instructions: Return if symptoms worsen or fail to improve.   Orders:  No orders of the defined types were placed in this encounter.  No orders of the defined types were placed in this encounter.   Imaging: No results found.  PMFS History: There are no active problems to display for this patient.  Past Medical History:  Diagnosis Date  . Asthma    sports induced  . Heart murmur    history of PDA    No family history on file.  Past Surgical History:  Procedure Laterality Date  . ANTERIOR CRUCIATE LIGAMENT REPAIR Right 06/05/2016   Procedure: RECONSTRUCTION ANTERIOR CRUCIATE LIGAMENT (ACL) WITH HAMSTRING GRAFT, PARTIAL LATERAL MENISECTOMY;  Surgeon: Cammy CopaScott Leanda Padmore, MD;  Location: [facility];  Service: Orthopedics;  Laterality: Right;  . PATENT DUCTUS ARTERIOUS REPAIR     age 89  . WISDOM TOOTH EXTRACTION     Social History   Occupational History  . Not on file.   Social History Main Topics  . Smoking status: Never Smoker  . Smokeless tobacco: Never Used  . Alcohol use No  . Drug use: No  . Sexual activity: Not on file

## 2016-11-21 ENCOUNTER — Ambulatory Visit (INDEPENDENT_AMBULATORY_CARE_PROVIDER_SITE_OTHER): Payer: Self-pay | Admitting: Orthopedic Surgery

## 2016-11-24 ENCOUNTER — Ambulatory Visit (INDEPENDENT_AMBULATORY_CARE_PROVIDER_SITE_OTHER): Payer: Self-pay | Admitting: Orthopedic Surgery

## 2016-11-24 DIAGNOSIS — S83511D Sprain of anterior cruciate ligament of right knee, subsequent encounter: Secondary | ICD-10-CM

## 2016-11-24 NOTE — Progress Notes (Signed)
   Post-Op Visit Note   Patient: Sherry Cummings           Date of Birth: Jan 28, 1996           MRN: [id number] Visit Date: 11/24/2016 PCP: Carollee HerterLALONDE,JOHN Garguilo, MD   Assessment & Plan:  Chief Complaint: No chief complaint on file.  Visit Diagnoses: No diagnosis found.  Plan: Patient is a 21 year old female who is now almost 6 months out from right knee anterior cruciate ligament reconstruction partial lateral meniscectomy.  She's doing well.  She has made very good gains in terms of regaining her strength.  She is at 85% on single leg hop and 95% contralateral side for triple leg hop.  On exam she has excellent range of motion no effusion great graft stability.  Plan at this time is to progress her activity.  I will see her back in 6 weeks before [place] [facility] [place].  In general she's making superb progress.  Discussed with mildly her activity progression over the next 6 weeks in terms of noncutting drills with the team.  Don't want her playing any Y ball but she is good to start doing some drills with the team.  We can add cutting and pivoting as well once she demonstrates good progression.  Follow-Up Instructions: No Follow-up on file.   Orders:  No orders of the defined types were placed in this encounter.  No orders of the defined types were placed in this encounter.   Imaging: No results found.  PMFS History: There are no active problems to display for this patient.  Past Medical History:  Diagnosis Date  . Asthma    sports induced  . Heart murmur    history of PDA    No family history on file.  Past Surgical History:  Procedure Laterality Date  . ANTERIOR CRUCIATE LIGAMENT REPAIR Right 06/05/2016   Procedure: RECONSTRUCTION ANTERIOR CRUCIATE LIGAMENT (ACL) WITH HAMSTRING GRAFT, PARTIAL LATERAL MENISECTOMY;  Surgeon: Cammy CopaScott Kyley Laurel, MD;  Location: [facility] OR;  Service: Orthopedics;  Laterality: Right;  . PATENT DUCTUS ARTERIOUS REPAIR     age 89  . WISDOM  TOOTH EXTRACTION     Social History   Occupational History  . Not on file.   Social History Main Topics  . Smoking status: Never Smoker  . Smokeless tobacco: Never Used  . Alcohol use No  . Drug use: No  . Sexual activity: Not on file

## 2016-12-29 NOTE — Progress Notes (Signed)
Patient is doing well following her right knee anterior cruciate ligament reconstruction.  She is walking normally has good graft stability minimal effusion improving leg strength.  Continue his rehabilitation protocol and we'll see her back in January

## 2017-04-06 ENCOUNTER — Ambulatory Visit (INDEPENDENT_AMBULATORY_CARE_PROVIDER_SITE_OTHER): Payer: Self-pay | Admitting: Orthopedic Surgery

## 2017-04-06 DIAGNOSIS — S83511D Sprain of anterior cruciate ligament of right knee, subsequent encounter: Secondary | ICD-10-CM

## 2017-04-06 NOTE — Progress Notes (Signed)
   Post-Op Visit Note   Patient: Sherry Cummings           Date of Birth: [birth year]           MRN: [id number] Visit Date: 04/06/2017 PCP: Ronnald NianLalonde, John C, MD   Assessment & Plan:  Chief Complaint: No chief complaint on file.  Visit Diagnoses: No diagnosis found.  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  She's been doing well.  On exam she has excellent quad and hamstring strength.  Symmetric thigh girth.  Stable knee with no effusion.  She is testing very well with functional testing with mildly.  Plan is gradual return to play with contact and scrimmage by September likely.  Follow-Up Instructions: Return if symptoms worsen or fail to improve.   Orders:  No orders of the defined types were placed in this encounter.  No orders of the defined types were placed in this encounter.   Imaging: No results found.  PMFS History: There are no active problems to display for this patient.  Past Medical History:  Diagnosis Date  . Asthma    sports induced  . Heart murmur    history of PDA    No family history on file.  Past Surgical History:  Procedure Laterality Date  . ANTERIOR CRUCIATE LIGAMENT REPAIR Right 06/05/2016   Procedure: RECONSTRUCTION ANTERIOR CRUCIATE LIGAMENT (ACL) WITH HAMSTRING GRAFT, PARTIAL LATERAL MENISECTOMY;  Surgeon: Cammy CopaScott Gregory Dean, MD;  Location: [facility] OR;  Service: Orthopedics;  Laterality: Right;  . PATENT DUCTUS ARTERIOUS REPAIR     age 89  . WISDOM TOOTH EXTRACTION     Social History   Occupational History  . Not on file.   Social History Main Topics  . Smoking status: Never Smoker  . Smokeless tobacco: Never Used  . Alcohol use No  . Drug use: No  . Sexual activity: Not on file

## 2017-04-08 ENCOUNTER — Telehealth (INDEPENDENT_AMBULATORY_CARE_PROVIDER_SITE_OTHER): Payer: Self-pay | Admitting: Orthopedic Surgery

## 2017-04-08 NOTE — Telephone Encounter (Signed)
04/06/2017 OV note emailed to Christoper FabianMolly Weber @ [employer] training room

## 2017-05-28 ENCOUNTER — Ambulatory Visit (INDEPENDENT_AMBULATORY_CARE_PROVIDER_SITE_OTHER): Payer: BLUE CROSS/BLUE SHIELD | Admitting: Orthopedic Surgery

## 2017-05-28 ENCOUNTER — Encounter (INDEPENDENT_AMBULATORY_CARE_PROVIDER_SITE_OTHER): Payer: Self-pay | Admitting: Orthopedic Surgery

## 2017-05-28 DIAGNOSIS — M25561 Pain in right knee: Secondary | ICD-10-CM

## 2017-05-28 DIAGNOSIS — S83511D Sprain of anterior cruciate ligament of right knee, subsequent encounter: Secondary | ICD-10-CM | POA: Diagnosis not present

## 2017-05-31 NOTE — Progress Notes (Signed)
Office Visit Note   Patient: Sherry Cummings           Date of Birth: 08/07/1996           MRN: [id number] Visit Date: 05/28/2017 Requested by: Ronnald Nian, MD [address] [place], [place] [address] PCP: Ronnald Nian, MD  Subjective: Chief Complaint  Patient presents with  . Right Knee - Pain    HPI: Patient is a 21 year old with right knee anterior cruciate ligament reconstruction done a year ago.  This was with hamstring autograft.  She also had partial posterior lateral meniscectomy.  She sustained a noncontact twisting injury to her knee during the season.  She has played about 7 games thus far.  She reports swelling weakness and giving way.  Injury happened about a week ago              ROS: All systems reviewed are negative as they relate to the chief complaint within the history of present illness.  Patient denies  fevers or chills.   Assessment & Plan: Visit Diagnoses:  1. Acute pain of right knee   2. Rupture of anterior cruciate ligament of right knee, subsequent encounter     Plan: Impression is possible recurrent tear anterior cruciate ligament.  Patient has an effusion and some laxity on exam.  There is a little bit of guarding somewhat difficult to get a great exam but that would be the concern at this time.  Plan MRI scan for reevaluation.  I'll see her back after that study likely in the training room.  If she requires revision it would likely be autograft bone patellar tendon bone.  Follow-Up Instructions: Return for after MRI.   Orders:  Orders Placed This Encounter  Procedures  . MR Knee Right w/o contrast   No orders of the defined types were placed in this encounter.     Procedures: No procedures performed   Clinical Data: No additional findings.  Objective: Vital Signs: There were no vitals taken for this visit.  Physical Exam:   Constitutional: Patient appears well-developed HEENT:  Head: Normocephalic Eyes:EOM are  normal Neck: Normal range of motion Cardiovascular: Normal rate Pulmonary/chest: Effort normal Neurologic: Patient is alert Skin: Skin is warm Psychiatric: Patient has normal mood and affect    Ortho Exam: Orthopedic exam demonstrates right knee effusion she's lacking about 7 or 8 of full extension and she has flexion fairly easily past 90.  Collateral ligaments are intact.  There is no posterior lateral rotatory instability asymmetrically right versus left.  There is some anterior laxity more in extension than flexion.  Extensor mechanism is intact  Specialty Comments:  No specialty comments available.  Imaging: No results found.   PMFS History: There are no active problems to display for this patient.  Past Medical History:  Diagnosis Date  . Asthma    sports induced  . Heart murmur    history of PDA    No family history on file.  Past Surgical History:  Procedure Laterality Date  . ANTERIOR CRUCIATE LIGAMENT REPAIR Right 06/05/2016   Procedure: RECONSTRUCTION ANTERIOR CRUCIATE LIGAMENT (ACL) WITH HAMSTRING GRAFT, PARTIAL LATERAL MENISECTOMY;  Surgeon: Cammy Copa, MD;  Location: [facility] OR;  Service: Orthopedics;  Laterality: Right;  . PATENT DUCTUS ARTERIOUS REPAIR     age 83  . WISDOM TOOTH EXTRACTION     Social History   Occupational History  . Not on file.   Social History Main Topics  . Smoking  status: Never Smoker  . Smokeless tobacco: Never Used  . Alcohol use No  . Drug use: No  . Sexual activity: Not on file

## 2017-06-03 ENCOUNTER — Ambulatory Visit
Admission: RE | Admit: 2017-06-03 | Discharge: 2017-06-03 | Disposition: A | Discharge status: Home / Self Care | Payer: BLUE CROSS/BLUE SHIELD | Source: Ambulatory Visit | Attending: Orthopedic Surgery | Admitting: Orthopedic Surgery

## 2017-06-03 DIAGNOSIS — M25561 Pain in right knee: Secondary | ICD-10-CM

## 2017-06-08 ENCOUNTER — Ambulatory Visit (INDEPENDENT_AMBULATORY_CARE_PROVIDER_SITE_OTHER): Payer: Self-pay | Admitting: Orthopedic Surgery

## 2017-06-08 DIAGNOSIS — S83511D Sprain of anterior cruciate ligament of right knee, subsequent encounter: Secondary | ICD-10-CM

## 2017-06-09 ENCOUNTER — Other Ambulatory Visit: Payer: BLUE CROSS/BLUE SHIELD

## 2017-07-08 NOTE — Progress Notes (Signed)
   Post-Op Visit Note   Patient: Sherry Cummings           Date of Birth: 10-15-1995           MRN: [id number] Visit Date: 06/08/2017 PCP: Ronnald NianLalonde, John C, MD   Assessment & Plan:  Chief Complaint: No chief complaint on file.  Visit Diagnoses:  1. Rupture of anterior cruciate ligament of right knee, subsequent encounter     Plan: Patient is 21 year old female with right knee reinjury.  Since I have seen her she has had an MRI scan which confirms a re-tear of her anterior cruciate ligament graft and possible medial meniscal tear.  She had lateral meniscal tear at the time of her index injury.  The medial meniscal tear does not appear to be unstable.  She would like to continue her season in a brace.  In general her quad and hamstring strength is excellent.  We'll get her the brace and although it has some risk for further injury to the knee we will see how she does with that.  We'll get that [employer] brace has a custom brace from the vendor.  See her back at the end of the season for further discussion about treatment options.  She does not plan to play soccer much in the future but does want to do physical therapy.  Follow-Up Instructions: Return if symptoms worsen or fail to improve.   Orders:  No orders of the defined types were placed in this encounter.  No orders of the defined types were placed in this encounter.   Imaging: No results found.  PMFS History: There are no active problems to display for this patient.  Past Medical History:  Diagnosis Date  . Asthma    sports induced  . Heart murmur    history of PDA    No family history on file.  Past Surgical History:  Procedure Laterality Date  . ANTERIOR CRUCIATE LIGAMENT REPAIR Right 06/05/2016   Procedure: RECONSTRUCTION ANTERIOR CRUCIATE LIGAMENT (ACL) WITH HAMSTRING GRAFT, PARTIAL LATERAL MENISECTOMY;  Surgeon: Cammy CopaScott Gregory Dean, MD;  Location: [facility];  Service: Orthopedics;  Laterality: Right;  . PATENT DUCTUS  ARTERIOUS REPAIR     age 89  . WISDOM TOOTH EXTRACTION     Social History   Occupational History  . Not on file.   Social History Main Topics  . Smoking status: Never Smoker  . Smokeless tobacco: Never Used  . Alcohol use No  . Drug use: No  . Sexual activity: Not on file

## 2017-07-31 ENCOUNTER — Emergency Department (HOSPITAL_COMMUNITY)
Admission: EM | Admit: 2017-07-31 | Discharge: 2017-07-31 | Disposition: A | Discharge status: Home / Self Care | Payer: BLUE CROSS/BLUE SHIELD | Attending: Emergency Medicine | Admitting: Emergency Medicine

## 2017-07-31 ENCOUNTER — Encounter (HOSPITAL_COMMUNITY): Payer: Self-pay | Admitting: Emergency Medicine

## 2017-07-31 ENCOUNTER — Emergency Department (HOSPITAL_COMMUNITY): Payer: BLUE CROSS/BLUE SHIELD

## 2017-07-31 ENCOUNTER — Other Ambulatory Visit: Payer: Self-pay

## 2017-07-31 DIAGNOSIS — Z7982 Long term (current) use of aspirin: Secondary | ICD-10-CM | POA: Insufficient documentation

## 2017-07-31 DIAGNOSIS — G8918 Other acute postprocedural pain: Secondary | ICD-10-CM | POA: Insufficient documentation

## 2017-07-31 DIAGNOSIS — R509 Fever, unspecified: Secondary | ICD-10-CM | POA: Insufficient documentation

## 2017-07-31 DIAGNOSIS — Z79899 Other long term (current) drug therapy: Secondary | ICD-10-CM | POA: Insufficient documentation

## 2017-07-31 DIAGNOSIS — J45909 Unspecified asthma, uncomplicated: Secondary | ICD-10-CM | POA: Insufficient documentation

## 2017-07-31 LAB — CBC WITH DIFFERENTIAL/PLATELET
BASOS ABS: 0 10*3/uL (ref 0.0–0.1)
BASOS PCT: 0 %
Eosinophils Absolute: 0 10*3/uL (ref 0.0–0.7)
Eosinophils Relative: 0 %
HEMATOCRIT: 41.4 % (ref 36.0–46.0)
HEMOGLOBIN: 14.4 g/dL (ref 12.0–15.0)
Lymphocytes Relative: 8 %
Lymphs Abs: 1.3 10*3/uL (ref 0.7–4.0)
MCH: 30.4 pg (ref 26.0–34.0)
MCHC: 34.8 g/dL (ref 30.0–36.0)
MCV: 87.3 fL (ref 78.0–100.0)
Monocytes Absolute: 0.8 10*3/uL (ref 0.1–1.0)
Monocytes Relative: 5 %
NEUTROS ABS: 13.5 10*3/uL — AB (ref 1.7–7.7)
Neutrophils Relative %: 87 %
Platelets: 303 10*3/uL (ref 150–400)
RBC: 4.74 MIL/uL (ref 3.87–5.11)
RDW: 11.5 % (ref 11.5–15.5)
WBC: 15.6 10*3/uL — ABNORMAL HIGH (ref 4.0–10.5)

## 2017-07-31 LAB — BASIC METABOLIC PANEL
ANION GAP: 11 (ref 5–15)
BUN: 10 mg/dL (ref 6–20)
CALCIUM: 8.8 mg/dL — AB (ref 8.9–10.3)
CO2: 24 mmol/L (ref 22–32)
Chloride: 97 mmol/L — ABNORMAL LOW (ref 101–111)
Creatinine, Ser: 0.99 mg/dL (ref 0.44–1.00)
GFR calc Af Amer: >60 mL/min (ref >60)
Glucose, Bld: 117 mg/dL — ABNORMAL HIGH (ref 65–99)
Potassium: 3.5 mmol/L (ref 3.5–5.1)
Sodium: 132 mmol/L — ABNORMAL LOW (ref 135–145)

## 2017-07-31 NOTE — ED Notes (Signed)
E-sign not available in room. [facility]. Pt to leave POV with mother

## 2017-07-31 NOTE — ED Notes (Signed)
Contacted [facility] for possible patient transfer

## 2017-07-31 NOTE — ED Notes (Signed)
Pt went to x-ray.

## 2017-07-31 NOTE — ED Triage Notes (Signed)
Pt presents with c/o fever up to 102 today, chills, body aches. Pt is 5 day post op R [facility] by Dr. Laney Pastoriboh. Pt states she spoke with surgeon and was told to come to ED.  Pt tearful in triage. Pt states pain medication catheter finished today, pt is controlled @ 5/10 Last Tylenol 500mg  at 1700, last Oxy IR at noon, pt has not removed inner bandage to surgical site

## 2017-07-31 NOTE — Discharge Instructions (Signed)
Go [facility] upon leaving here to be evaluated by the orthopedic specialist

## 2017-07-31 NOTE — ED Provider Notes (Signed)
[facility] EMERGENCY DEPARTMENT Provider Note   CSN: [id number] Arrival date & time: 07/31/17  1918     History   Chief Complaint Chief Complaint  Patient presents with  . Post-op Problem    HPI Sherry Cummings is a 21 y.o. female.  Had arthroscopic surgery of her right knee performeD [facility] ago [facility].  Today she awakened with chills and had temperature of 102 degrees while at home.  She treated self with Tylenol 5 PM today.  She denies any vomiting denies abdominal pain denies urinary symptoms denies cough denies headache.  Only complaint is right knee pain.  No other associated symptoms.  HPI  Past Medical History:  Diagnosis Date  . Asthma    sports induced  . Heart murmur    history of PDA    There are no active problems to display for this patient.   Past Surgical History:  Procedure Laterality Date  . ANTERIOR CRUCIATE LIGAMENT REPAIR Right 06/05/2016   Procedure: RECONSTRUCTION ANTERIOR CRUCIATE LIGAMENT (ACL) WITH HAMSTRING GRAFT, PARTIAL LATERAL MENISECTOMY;  Surgeon: Cammy CopaScott Gregory Dean, MD;  Location: [facility] OR;  Service: Orthopedics;  Laterality: Right;  . PATENT DUCTUS ARTERIOUS REPAIR     age 89  . WISDOM TOOTH EXTRACTION      OB History    No data available       Home Medications    Prior to Admission medications   Medication Sig Start Date End Date Taking? Authorizing Provider  aspirin EC 325 MG tablet Take 1 tablet (325 mg total) by mouth daily. 06/05/16   Cammy Copaean, Gregory Scott, MD  methocarbamol (ROBAXIN) 500 MG tablet Take 1 tablet (500 mg total) by mouth every 8 (eight) hours as needed for muscle spasms. 06/05/16   Cammy Copaean, Gregory Scott, MD  montelukast (SINGULAIR) 10 MG tablet Take 10 mg by mouth at bedtime.    [provider]  Multiple Vitamins-Minerals (MULTIVITAMIN WITH MINERALS) tablet Take 2 tablets by mouth daily.     [provider]  oxyCODONE-acetaminophen (ROXICET) 5-325 MG tablet Take 1-2 tablets by  mouth every 6 (six) hours as needed for severe pain. 06/05/16   Cammy Copaean, Gregory Scott, MD    Family History No family history on file.  Social History Social History   Tobacco Use  . Smoking status: Never Smoker  . Smokeless tobacco: Never Used  Substance Use Topics  . Alcohol use: No    Alcohol/week: 0.0 oz  . Drug use: No     Allergies   Patient has no known allergies.   Review of Systems Review of Systems  Constitutional: Positive for chills and fever.  HENT: Negative.   Respiratory: Negative.   Cardiovascular: Negative.   Gastrointestinal: Negative.   Musculoskeletal: Positive for arthralgias.       Right knee pain  Skin: Negative.   Neurological: Negative.   Psychiatric/Behavioral: Negative.   All other systems reviewed and are negative.    Physical Exam Updated Vital Signs BP 103/84   Pulse (!) 108   Temp 99 F (37.2 C) (Oral)   Resp 19   Ht 5\' 7"  (1.702 m)   Wt 58.5 kg (129 lb)   LMP 07/13/2017   SpO2 97%   BMI 20.20 kg/m   Physical Exam  Constitutional: She is oriented to person, place, and time. She appears well-developed and well-nourished. No distress.  HENT:  Head: Normocephalic and atraumatic.  Eyes: Conjunctivae are normal. Pupils are equal, round, and reactive to light.  Neck: Neck supple. No tracheal deviation present. No thyromegaly present.  Cardiovascular: Regular rhythm.  No murmur heard. Mild tachycardic  Pulmonary/Chest: Effort normal and breath sounds normal.  Abdominal: Soft. Bowel sounds are normal. She exhibits no distension. There is no tenderness.  Musculoskeletal: Normal range of motion. She exhibits no edema or tenderness.  Lower extremity in knee brace.  Brace was removed and bandage was removed to reveal a clean appearing surgical wound without drainage over the anterior knee and the lateral knee.  No swelling, minimal tenderness.  DP pulse 2+.  Good capillary refill.  All other extremities without redness swelling or  tenderness neurovascularly intact  Neurological: She is alert and oriented to person, place, and time. Coordination normal.  Skin: Skin is warm and dry. No rash noted.  Psychiatric: She has a normal mood and affect.  Nursing note and vitals reviewed.    ED Treatments / Results  Labs (all labs ordered are listed, but only abnormal results are displayed) Labs Reviewed  BASIC METABOLIC PANEL  CBC WITH DIFFERENTIAL/PLATELET    EKG  EKG Interpretation None       Radiology No results found.  Procedures Procedures (including critical care time)  Medications Ordered in ED Medications - No data to display  X-ray viewed by me Results for orders placed or performed during the hospital encounter of 07/31/17  Basic metabolic panel  Result Value Ref Range   Sodium 132 (L) 135 - 145 mmol/L   Potassium 3.5 3.5 - 5.1 mmol/L   Chloride 97 (L) 101 - 111 mmol/L   CO2 24 22 - 32 mmol/L   Glucose, Bld 117 (H) 65 - 99 mg/dL   BUN 10 6 - 20 mg/dL   Creatinine, Ser 1.61 0.44 - 1.00 mg/dL   Calcium 8.8 (L) 8.9 - 10.3 mg/dL   GFR calc non Af Amer >60 >60 mL/min   GFR calc Af Amer >60 >60 mL/min   Anion gap 11 5 - 15  CBC with Differential/Platelet  Result Value Ref Range   WBC 15.6 (H) 4.0 - 10.5 K/uL   RBC 4.74 3.87 - 5.11 MIL/uL   Hemoglobin 14.4 12.0 - 15.0 g/dL   HCT 09.6 04.5 - 40.9 %   MCV 87.3 78.0 - 100.0 fL   MCH 30.4 26.0 - 34.0 pg   MCHC 34.8 30.0 - 36.0 g/dL   RDW 81.1 91.4 - 78.2 %   Platelets 303 150 - 400 K/uL   Neutrophils Relative % 87 %   Neutro Abs 13.5 (H) 1.7 - 7.7 K/uL   Lymphocytes Relative 8 %   Lymphs Abs 1.3 0.7 - 4.0 K/uL   Monocytes Relative 5 %   Monocytes Absolute 0.8 0.1 - 1.0 K/uL   Eosinophils Relative 0 %   Eosinophils Absolute 0.0 0.0 - 0.7 K/uL   Basophils Relative 0 %   Basophils Absolute 0.0 0.0 - 0.1 K/uL   Dg Knee Complete 4 Views Right  Result Date: 07/31/2017 CLINICAL DATA:  Postoperative right knee pain. ACL and medial meniscal  repair 5 days prior. EXAM: RIGHT KNEE - COMPLETE 4+ VIEW COMPARISON:  No prior radiographs. FINDINGS: Anterior cruciate ligament repair with tibial and femoral tunnels. No abnormal surrounding lucency. The osseous abnormality, no bony destructive change, fracture or dislocation. Small joint effusion. Air in the subcutaneous tissues of the anterior knee is likely postsurgical. Soft tissue edema laterally. IMPRESSION: Anterior cruciate ligament repair with small joint effusion. Air in the anterior subcutaneous tissues with soft tissue edema,  not unexpected 5 days postop. Electronically Signed   By: Rubye OaksMelanie  Ehinger M.D.   On: 07/31/2017 21:33   Initial Impression / Assessment and Plan / ED Course  I have reviewed the triage vital signs and the nursing notes.  Pertinent labs & imaging results that were available during my care of the patient were reviewed by me and considered in my medical decision making (see chart for details).     I consulted Dr. Jovita GammaLark, orthoped[facility] at [facility] by telephone.  Plan [facility] to be evaluated by orthopedic team. She will be driven by her mother. Final Clinical Impressions(s) / ED Diagnoses  Diagnoses #1 postoperative pain #2 postoperative fever Final diagnoses:  None    ED Discharge Orders    None       Doug SouJacubowitz, Gannon Heinzman, MD 07/31/17 2237

## 2018-05-26 ENCOUNTER — Telehealth (INDEPENDENT_AMBULATORY_CARE_PROVIDER_SITE_OTHER): Payer: Self-pay | Admitting: Orthopedic Surgery

## 2018-05-26 NOTE — Telephone Encounter (Signed)
I received vm from Patient wanting to get the CD of MRI's.I called her back advised that she needs to get those from [facility] where she had them done. She voiced understanding

## 2018-06-18 ENCOUNTER — Encounter: Payer: Self-pay | Admitting: Family Medicine

## 2019-01-06 IMAGING — MR MR KNEE*R* W/O CM
4 of 6 series · 15 of 40 positions shown · non-contrast
Comparison: 05/23/2016

CLINICAL DATA: Right knee pain, hamstring pain. Prior ACL repair
8225.

EXAM:
MRI OF THE RIGHT KNEE WITHOUT CONTRAST
TECHNIQUE: Multiplanar, multisequence MR imaging of the knee was performed. No
intravenous contrast was administered.

[Series 3: PD · axial · 4.0mm · 0.50mm/px · z∈[-64,+41]mm · 3 of 30 slices shown (1 of 2)]
[im 4/30]
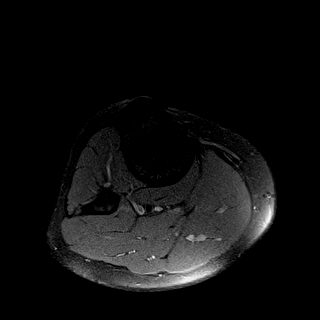
[im 17/30]
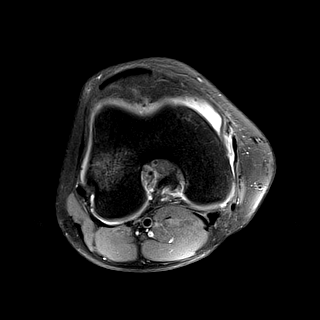
[im 26/30]
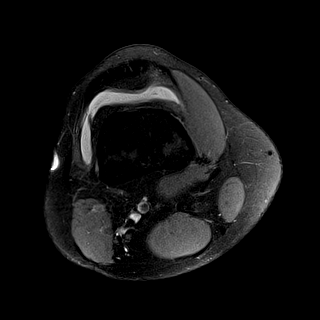

[Series 4: PD fat-sat · sagittal · 3.5mm · 0.21mm/px · 6 of 24 slices shown (1 of 2)]
[im 1/24]
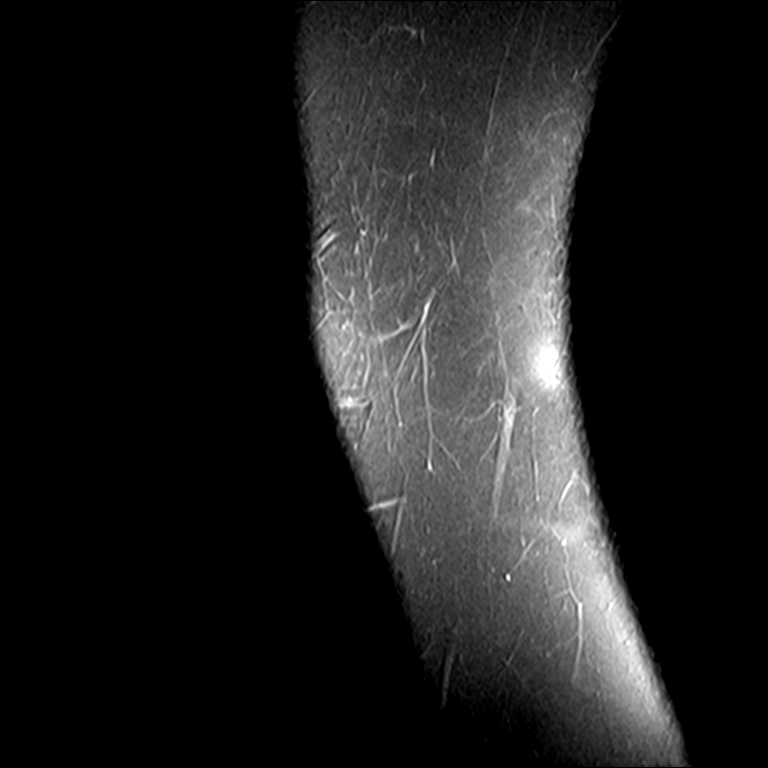
[im 4/24]
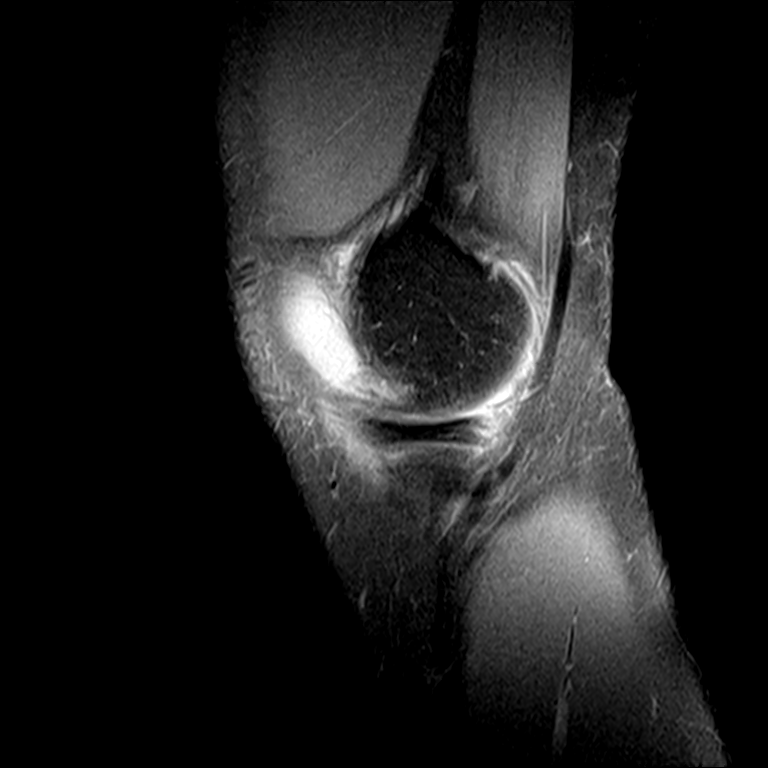
[im 7/24]
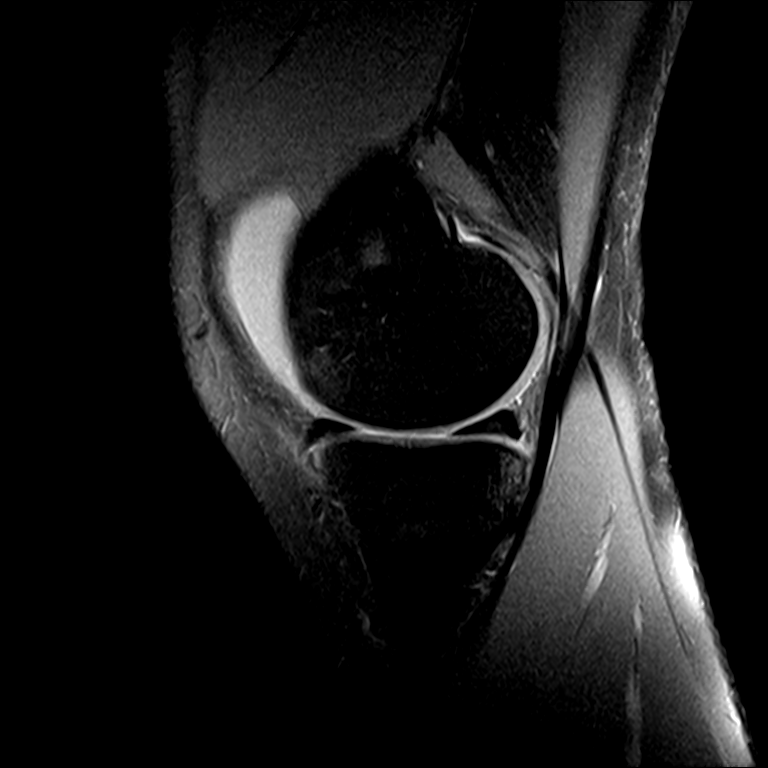
[im 10/24]
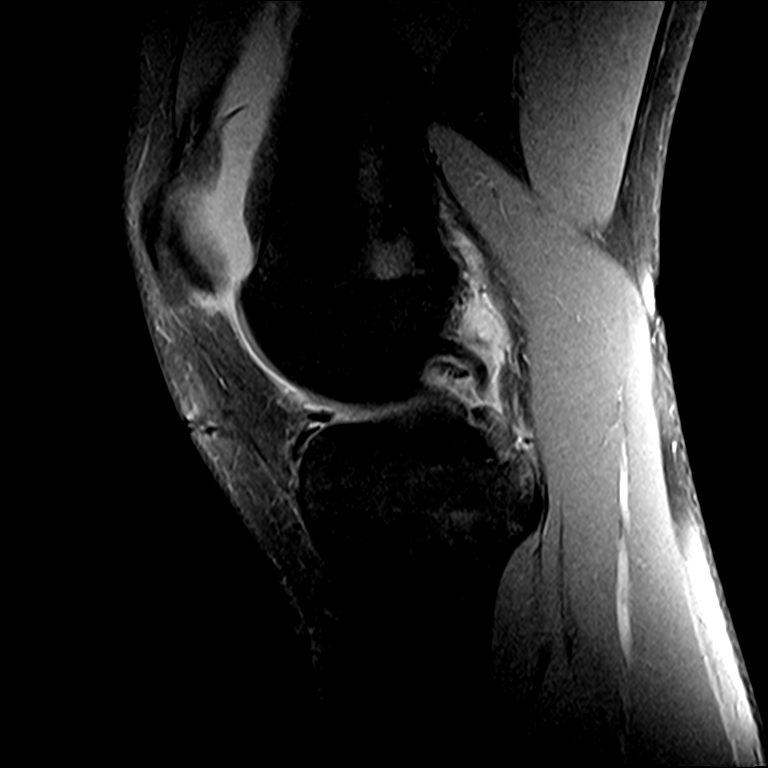
[im 14/24]
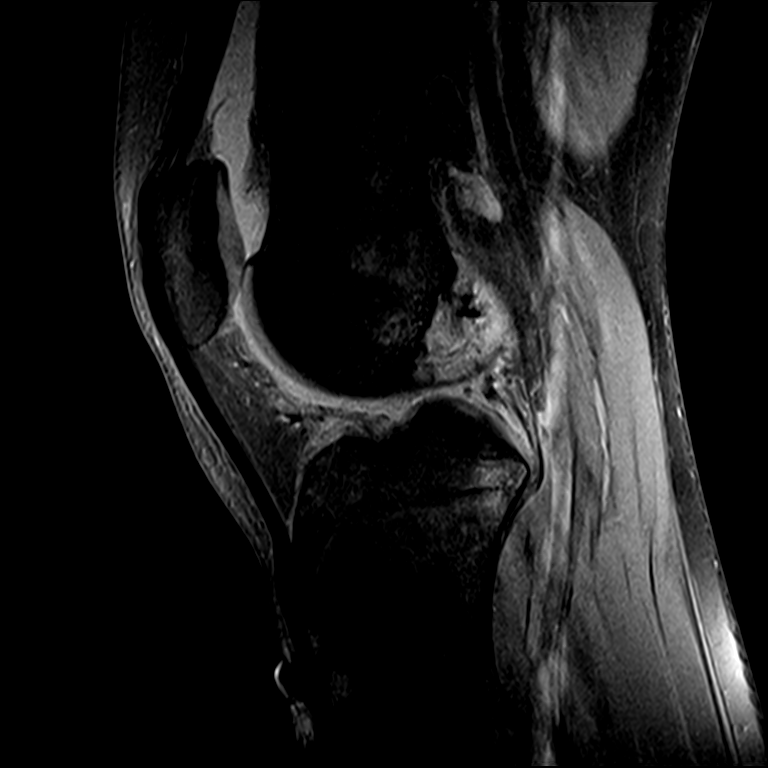
[im 20/24]
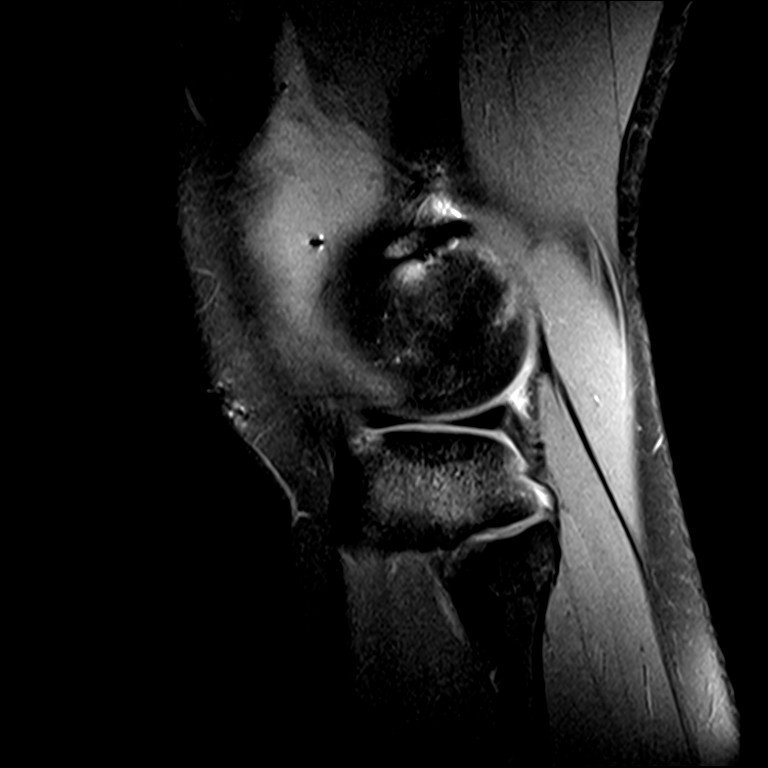

[Series 6: PD · coronal · 4.0mm · 0.53mm/px · 3 of 20 slices shown (2 of 2)]
[im 4/20]
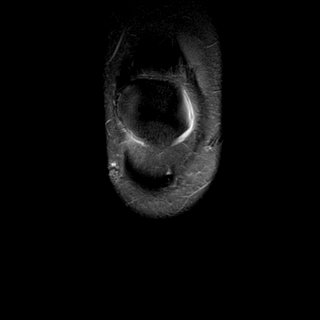
[im 12/20]
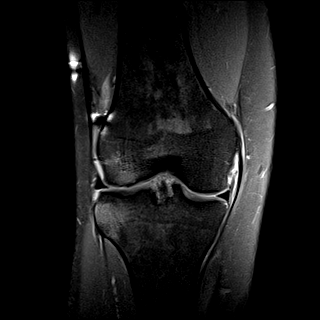
[im 20/20]
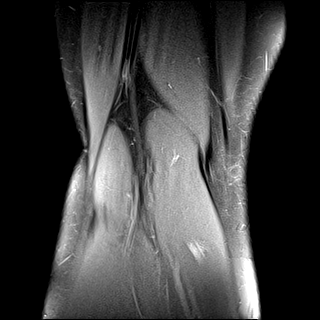

[Series 9: PD fat-sat · coronal · 2.0mm · 0.29mm/px · 3 of 11 slices shown (2 of 2)]
[im 1/11]
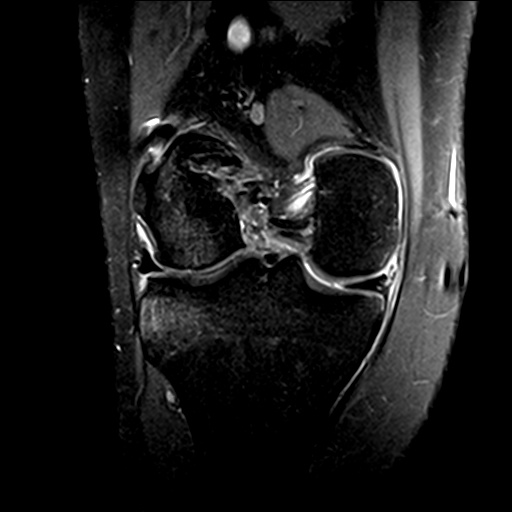
[im 7/11]
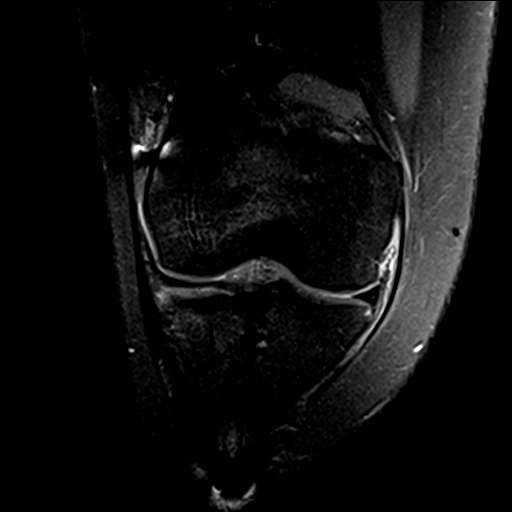
[im 11/11]
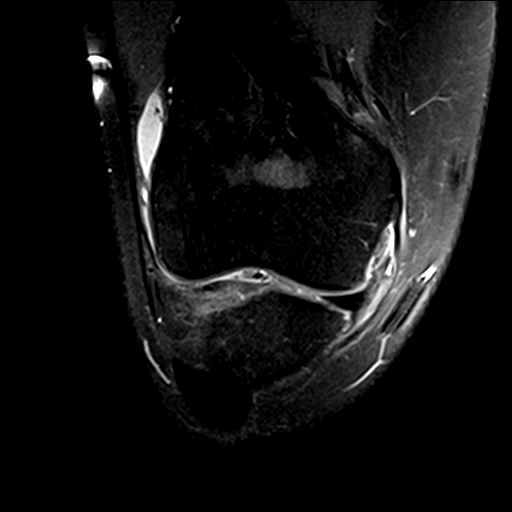

[15 of 40 positions shown; findings below may reference images not displayed]

FINDINGS: MENISCI

Medial meniscus: Small vertical tear at the periphery of the
posterior horn-body junction of the medial meniscus (image 7/series
6).

Lateral meniscus: Severe attenuation of the posterior horn of the
lateral meniscus consistent with prior meniscectomy.

LIGAMENTS

Cruciates: Prior ACL repair. Complete tear of the ACL graft. Tibial
tunnel is in satisfactory position. Intact PCL.

Collaterals: Medial collateral ligament is intact. Lateral
collateral ligament complex is intact.

CARTILAGE

Patellofemoral:  No chondral defect.

Medial:  No chondral defect.

Lateral:  No chondral defect.

Joint: Large joint effusion. Small foci of low signal in Fierro
fact and along the ACL graft placement consistent with postsurgical
changes. No plical thickening.

Popliteal Fossa: No significant Baker's cyst. Intact popliteus
tendon.

Extensor Mechanism: Intact quadriceps tendon and patellar tendon.
Intact medial and lateral patellar retinaculum. Intact MPFL.

Bones: Bone marrow edema in the anterolateral femoral condyles and
posterolateral tibial plateau consistent with osseous contusions.
Small area of bone marrow edema in the posteromedial tibial plateau.

Soft tissue: No fluid collection or hematoma.  Muscles are normal.
IMPRESSION: 1. Prior ACL repair with complete tear of the ACL graft.
2. Small vertical tear at the periphery of the posterior horn-body
junction of the medial meniscus (image 7/series 6).
3. Severe attenuation of the posterior horn of the lateral meniscus
consistent with prior meniscectomy.
4. Large joint effusion.
5. Osseous contusions of the anterolateral femoral condyle,
posterolateral tibial plateau and to lesser extent posteromedial
tibial plateau.

## 2020-06-04 LAB — RESULTS CONSOLE HPV: CHL HPV: NEGATIVE
